# Patient Record
Sex: Male | Born: 1960 | Race: Black or African American | Hispanic: No | Marital: Single | Smoking: Heavy tobacco smoker
Health system: Southern US, Community
[De-identification: ages and names within clinical notes are randomized; demographics above are authoritative.]

## PROBLEM LIST (undated history)

## (undated) DIAGNOSIS — I10 Essential (primary) hypertension: Secondary | ICD-10-CM

## (undated) HISTORY — PX: CARDIAC SURGERY: SHX584

## (undated) HISTORY — PX: ANKLE SURGERY: SHX546

## (undated) HISTORY — PX: ABDOMINAL SURGERY: SHX537

## (undated) HISTORY — PX: RECONSTRUCTION, ANKLE: SHX5009

## (undated) HISTORY — DX: Essential (primary) hypertension: I10

## (undated) HISTORY — PX: OTHER SURGICAL HISTORY: SHX169

---

## 2011-01-08 ENCOUNTER — Observation Stay
Admission: EM | Admit: 2011-01-08 | Discharge: 2011-01-08 | Disposition: A | Discharge status: Home / Self Care | Payer: Self-pay | Source: Emergency Department | Attending: Surgery | Admitting: Surgery

## 2011-01-08 LAB — CBC AND DIFFERENTIAL
Basophils Absolute Automated: 0.02 10*3/uL (ref 0.00–0.20)
Basophils Automated: 0 % (ref 0–2)
Eosinophils Absolute Automated: 0.01 10*3/uL (ref 0.00–0.70)
Eosinophils Automated: 0 % (ref 0–5)
Hematocrit: 45.1 % (ref 42.0–52.0)
Hgb: 15.9 g/dL (ref 13.0–17.0)
Immature Granulocytes Absolute: 0.01 10*3/uL
Immature Granulocytes: 0 % (ref 0–1)
Lymphocytes Absolute Automated: 4.11 10*3/uL (ref 0.50–4.40)
Lymphocytes Automated: 49 % — ABNORMAL HIGH (ref 15–41)
MCH: 30.8 pg (ref 28.0–32.0)
MCHC: 35.3 g/dL (ref 32.0–36.0)
MCV: 87.4 fL (ref 80.0–100.0)
MPV: 11.6 fL (ref 9.4–12.3)
Monocytes Absolute Automated: 0.4 10*3/uL (ref 0.00–1.20)
Monocytes: 5 % (ref 0–11)
Neutrophils Absolute: 3.83 10*3/uL (ref 1.80–8.10)
Neutrophils: 46 % — ABNORMAL LOW (ref 52–75)
Nucleated RBC: 0 /100 WBC
Platelets: 200 10*3/uL (ref 140–400)
RBC: 5.16 10*6/uL (ref 4.70–6.00)
RDW: 15 % (ref 12–15)
WBC: 8.38 10*3/uL (ref 3.50–10.80)

## 2011-01-08 LAB — BASIC METABOLIC PANEL
BUN: 7 mg/dL — ABNORMAL LOW (ref 8–20)
CO2: 22 mEq/L (ref 21–30)
Calcium: 9.4 mg/dL (ref 8.6–10.2)
Chloride: 109 mEq/L — ABNORMAL HIGH (ref 98–107)
Creatinine: 0.9 mg/dL (ref 0.6–1.5)
Glucose: 113 mg/dL — ABNORMAL HIGH (ref 70–100)
Potassium: 4.5 mEq/L (ref 3.6–5.0)
Sodium: 147 mEq/L — ABNORMAL HIGH (ref 136–146)

## 2011-01-08 LAB — RAPID DRUG SCREEN, URINE
Barbiturate Screen, UR: NOT DETECTED
Benzodiazepine Screen, UR: NOT DETECTED
Cannabinoid Screen, UR: NOT DETECTED
Cocaine, UR: DETECTED — AB
Opiate Screen, UR: NOT DETECTED
PCP Screen, UR: NOT DETECTED
Urine Amphetamine Screen: NOT DETECTED

## 2011-01-08 LAB — PT AND APTT F/C
PT INR: 1 (ref 0.9–1.1)
PT: 13 s (ref 12.6–15.0)
PTT: 30 s (ref 23–37)

## 2011-01-08 LAB — GFR: EGFR: >60

## 2011-01-08 LAB — ETHANOL: Alcohol: 300 mg/dL — ABNORMAL HIGH

## 2011-03-25 LAB — ECG 12-LEAD
Atrial Rate: 85 {beats}/min
P Axis: 59 degrees
P-R Interval: 158 ms
Q-T Interval: 370 ms
QRS Duration: 86 ms
QTC Calculation (Bezet): 440 ms
R Axis: 76 degrees
T Axis: 31 degrees
Ventricular Rate: 85 {beats}/min
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
## 2014-03-16 ENCOUNTER — Emergency Department: Payer: Charity

## 2014-03-16 ENCOUNTER — Inpatient Hospital Stay: Payer: Charity | Admitting: Surgery

## 2014-03-16 ENCOUNTER — Encounter
Admission: EM | Disposition: A | Discharge status: Home / Self Care | Payer: Self-pay | Source: Home / Self Care | Attending: Surgery

## 2014-03-16 ENCOUNTER — Encounter: Payer: Self-pay | Admitting: Anesthesiology

## 2014-03-16 ENCOUNTER — Other Ambulatory Visit: Payer: Self-pay

## 2014-03-16 ENCOUNTER — Inpatient Hospital Stay
Admission: EM | Admit: 2014-03-16 | Discharge: 2014-03-19 | DRG: 331 | Disposition: A | Discharge status: Home / Self Care | Payer: Charity | Attending: Surgery | Admitting: Surgery

## 2014-03-16 DIAGNOSIS — S31109A Unspecified open wound of abdominal wall, unspecified quadrant without penetration into peritoneal cavity, initial encounter: Secondary | ICD-10-CM | POA: Diagnosis present

## 2014-03-16 DIAGNOSIS — S36509A Unspecified injury of unspecified part of colon, initial encounter: Secondary | ICD-10-CM | POA: Diagnosis present

## 2014-03-16 DIAGNOSIS — S36501A Unspecified injury of transverse colon, initial encounter: Principal | ICD-10-CM | POA: Diagnosis present

## 2014-03-16 DIAGNOSIS — S31119A Laceration without foreign body of abdominal wall, unspecified quadrant without penetration into peritoneal cavity, initial encounter: Secondary | ICD-10-CM

## 2014-03-16 DIAGNOSIS — F1010 Alcohol abuse, uncomplicated: Secondary | ICD-10-CM | POA: Diagnosis present

## 2014-03-16 DIAGNOSIS — G8911 Acute pain due to trauma: Secondary | ICD-10-CM | POA: Diagnosis present

## 2014-03-16 DIAGNOSIS — Z5331 Laparoscopic surgical procedure converted to open procedure: Secondary | ICD-10-CM

## 2014-03-16 DIAGNOSIS — F17200 Nicotine dependence, unspecified, uncomplicated: Secondary | ICD-10-CM | POA: Diagnosis present

## 2014-03-16 DIAGNOSIS — I10 Essential (primary) hypertension: Secondary | ICD-10-CM | POA: Diagnosis present

## 2014-03-16 HISTORY — DX: Essential (primary) hypertension: I10

## 2014-03-16 HISTORY — PX: LAPAROSCOPY, DIAGNOSTIC: SHX4584

## 2014-03-16 LAB — BASIC METABOLIC PANEL
BUN: 11 mg/dL (ref 9.0–28.0)
CO2: 19 mEq/L — ABNORMAL LOW (ref 22–29)
Calcium: 8.9 mg/dL (ref 8.5–10.5)
Chloride: 102 mEq/L (ref 100–111)
Creatinine: 1 mg/dL (ref 0.7–1.3)
Glucose: 175 mg/dL — ABNORMAL HIGH (ref 70–100)
Potassium: 4.1 mEq/L (ref 3.5–5.1)
Sodium: 135 mEq/L — ABNORMAL LOW (ref 136–145)

## 2014-03-16 LAB — CBC
Hematocrit: 42.5 % (ref 42.0–52.0)
Hgb: 14.5 g/dL (ref 13.0–17.0)
MCH: 30.9 pg (ref 28.0–32.0)
MCHC: 34.1 g/dL (ref 32.0–36.0)
MCV: 90.6 fL (ref 80.0–100.0)
MPV: 11.6 fL (ref 9.4–12.3)
Nucleated RBC: 0 /100 WBC (ref 0–1)
Platelets: 153 10*3/uL (ref 140–400)
RBC: 4.69 10*6/uL — ABNORMAL LOW (ref 4.70–6.00)
RDW: 15 % (ref 12–15)
WBC: 8.14 10*3/uL (ref 3.50–10.80)

## 2014-03-16 LAB — ETHANOL: Alcohol: 193 mg/dL — ABNORMAL HIGH

## 2014-03-16 LAB — GFR: EGFR: >60

## 2014-03-16 SURGERY — LAPAROSCOPY, DIAGNOSTIC
Anesthesia: Anesthesia General | Site: Abdomen | Wound class: Clean Contaminated

## 2014-03-16 MED ORDER — SODIUM CHLORIDE 0.9 % IV SOLN
INTRAVENOUS | Status: AC | PRN
Start: 2014-03-16 — End: 2014-03-16
  Administered 2014-03-16: 1000 mL via INTRAVENOUS

## 2014-03-16 MED ORDER — LIDOCAINE HCL 1 % IJ SOLN
INTRAMUSCULAR | Status: AC
Start: 2014-03-16 — End: ?
  Filled 2014-03-16: qty 20

## 2014-03-16 MED ORDER — LIDOCAINE HCL (PF) 2 % IJ SOLN
INTRAMUSCULAR | Status: AC
Start: 2014-03-16 — End: ?
  Filled 2014-03-16: qty 5

## 2014-03-16 MED ORDER — PROPOFOL 10 MG/ML IV EMUL
INTRAVENOUS | Status: AC
Start: 2014-03-16 — End: ?
  Filled 2014-03-16: qty 40

## 2014-03-16 MED ORDER — FENTANYL CITRATE 0.05 MG/ML IJ SOLN
INTRAMUSCULAR | Status: AC
Start: 2014-03-16 — End: ?
  Filled 2014-03-16: qty 2

## 2014-03-16 MED ORDER — PROPOFOL 10 MG/ML IV EMUL
INTRAVENOUS | Status: AC
Start: 2014-03-16 — End: ?
  Filled 2014-03-16: qty 20

## 2014-03-16 MED ORDER — ROCURONIUM BROMIDE 50 MG/5ML IV SOLN
INTRAVENOUS | Status: AC
Start: 2014-03-16 — End: ?
  Filled 2014-03-16: qty 5

## 2014-03-16 MED ORDER — EPHEDRINE SULFATE 50 MG/ML IJ SOLN
INTRAMUSCULAR | Status: AC
Start: 2014-03-16 — End: ?
  Filled 2014-03-16: qty 1

## 2014-03-16 MED ORDER — SUCCINYLCHOLINE CHLORIDE 20 MG/ML IJ SOLN
INTRAMUSCULAR | Status: AC
Start: 2014-03-16 — End: ?
  Filled 2014-03-16: qty 10

## 2014-03-16 MED ORDER — FAMOTIDINE 20 MG/2ML IV SOLN
INTRAVENOUS | Status: AC
Start: 2014-03-16 — End: ?
  Filled 2014-03-16: qty 2

## 2014-03-16 MED ORDER — BUPIVACAINE-EPINEPHRINE (PF) 0.5% -1:200000 IJ SOLN
INTRAMUSCULAR | Status: AC
Start: 2014-03-16 — End: ?
  Filled 2014-03-16: qty 20

## 2014-03-16 SURGICAL SUPPLY — 1 items: DRESSING FLEXZAN 4X4 (Dressing) ×20 IMPLANT

## 2014-03-16 NOTE — ED Notes (Signed)
ID band applied to LEFT wrist.

## 2014-03-16 NOTE — Anesthesia Preprocedure Evaluation (Addendum)
Anesthesia Evaluation    AIRWAY    Mallampati: II    TM distance: >3 FB  Neck ROM: full  Mouth Opening:full   CARDIOVASCULAR    normal       DENTAL         PULMONARY    pulmonary exam normal and clear to auscultation     OTHER FINDINGS    Extremely poor dentition.  Fractured teeth                Anesthesia Plan    ASA 3 - emergent     general                     intravenous induction   Detailed anesthesia plan: general endotracheal        Post op pain management: per surgeon        Plan discussed with CRNA.
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
## 2014-03-17 ENCOUNTER — Emergency Department: Payer: Charity | Admitting: Anesthesiology

## 2014-03-17 DIAGNOSIS — G8911 Acute pain due to trauma: Secondary | ICD-10-CM

## 2014-03-17 DIAGNOSIS — S31119A Laceration without foreign body of abdominal wall, unspecified quadrant without penetration into peritoneal cavity, initial encounter: Secondary | ICD-10-CM | POA: Diagnosis present

## 2014-03-17 DIAGNOSIS — S36509A Unspecified injury of unspecified part of colon, initial encounter: Secondary | ICD-10-CM

## 2014-03-17 DIAGNOSIS — S36501A Unspecified injury of transverse colon, initial encounter: Secondary | ICD-10-CM

## 2014-03-17 LAB — CBC AND DIFFERENTIAL
Basophils Absolute Automated: 0.01 10*3/uL (ref 0.00–0.20)
Basophils Automated: 0 %
Eosinophils Absolute Automated: 0 10*3/uL (ref 0.00–0.70)
Eosinophils Automated: 0 %
Hematocrit: 28 % — ABNORMAL LOW (ref 42.0–52.0)
Hgb: 9.5 g/dL — ABNORMAL LOW (ref 13.0–17.0)
Immature Granulocytes Absolute: 0.02 10*3/uL
Immature Granulocytes: 0 %
Lymphocytes Absolute Automated: 1.96 10*3/uL (ref 0.50–4.40)
Lymphocytes Automated: 20 %
MCH: 30.8 pg (ref 28.0–32.0)
MCHC: 33.9 g/dL (ref 32.0–36.0)
MCV: 90.9 fL (ref 80.0–100.0)
MPV: 11.6 fL (ref 9.4–12.3)
Monocytes Absolute Automated: 0.62 10*3/uL (ref 0.00–1.20)
Monocytes: 6 %
Neutrophils Absolute: 7.39 10*3/uL (ref 1.80–8.10)
Neutrophils: 74 %
Nucleated RBC: 0 /100 WBC (ref 0–1)
Platelets: 123 10*3/uL — ABNORMAL LOW (ref 140–400)
RBC: 3.08 10*6/uL — ABNORMAL LOW (ref 4.70–6.00)
RDW: 15 % (ref 12–15)
WBC: 10 10*3/uL (ref 3.50–10.80)

## 2014-03-17 LAB — TYPE AND SCREEN
AB Screen Gel: NEGATIVE
ABO Rh: O POS

## 2014-03-17 LAB — RED BLOOD CELLS OR HOLD

## 2014-03-17 LAB — CBC
Hematocrit: 31.1 % — ABNORMAL LOW (ref 42.0–52.0)
Hgb: 10.3 g/dL — ABNORMAL LOW (ref 13.0–17.0)
MCH: 30 pg (ref 28.0–32.0)
MCHC: 33.1 g/dL (ref 32.0–36.0)
MCV: 90.7 fL (ref 80.0–100.0)
MPV: 12 fL (ref 9.4–12.3)
Nucleated RBC: 0 /100 WBC (ref 0–1)
Platelets: 130 10*3/uL — ABNORMAL LOW (ref 140–400)
RBC: 3.43 10*6/uL — ABNORMAL LOW (ref 4.70–6.00)
RDW: 15 % (ref 12–15)
WBC: 11.39 10*3/uL — ABNORMAL HIGH (ref 3.50–10.80)

## 2014-03-17 LAB — PT AND APTT
PT INR: 1.1 (ref 0.9–1.1)
PT: 13.9 s (ref 12.6–15.0)
PTT: 27 s (ref 23–37)

## 2014-03-17 LAB — HEMOGLOBIN AND HEMATOCRIT, BLOOD
Hematocrit: 33.6 % — ABNORMAL LOW (ref 42.0–52.0)
Hgb: 11.6 g/dL — ABNORMAL LOW (ref 13.0–17.0)

## 2014-03-17 MED ORDER — CEFAZOLIN SODIUM 1 G IJ SOLR
INTRAMUSCULAR | Status: DC | PRN
Start: 2014-03-17 — End: 2014-03-17
  Administered 2014-03-17: 2 g via INTRAVENOUS

## 2014-03-17 MED ORDER — MEPERIDINE HCL 25 MG/ML IJ SOLN
25.0000 mg | INTRAMUSCULAR | Status: DC | PRN
Start: 2014-03-17 — End: 2014-03-17

## 2014-03-17 MED ORDER — LACTATED RINGERS IV SOLN
INTRAVENOUS | Status: DC
Start: 2014-03-17 — End: 2014-03-17

## 2014-03-17 MED ORDER — MICROFIBRILLAR COLL HEMOSTAT EX PADS
MEDICATED_PAD | CUTANEOUS | Status: DC | PRN
Start: 2014-03-17 — End: 2014-03-17
  Administered 2014-03-17: 1 via TOPICAL

## 2014-03-17 MED ORDER — NEOSTIGMINE METHYLSULFATE 1 MG/ML IJ SOLN
INTRAMUSCULAR | Status: DC | PRN
Start: 2014-03-17 — End: 2014-03-17
  Administered 2014-03-17: 9 mL via INTRAVENOUS

## 2014-03-17 MED ORDER — OXYCODONE HCL 5 MG PO TABS
10.0000 mg | ORAL_TABLET | ORAL | Status: DC | PRN
Start: 2014-03-17 — End: 2014-03-19
  Administered 2014-03-17 – 2014-03-19 (×6): 10 mg via ORAL
  Filled 2014-03-17 (×6): qty 2

## 2014-03-17 MED ORDER — OXYCODONE HCL 5 MG PO TABS
5.0000 mg | ORAL_TABLET | ORAL | Status: DC | PRN
Start: 2014-03-17 — End: 2014-03-19

## 2014-03-17 MED ORDER — FAMOTIDINE 10 MG/ML IV SOLN (WRAP)
INTRAVENOUS | Status: DC | PRN
Start: 2014-03-17 — End: 2014-03-17
  Administered 2014-03-17: 20 mg via INTRAVENOUS

## 2014-03-17 MED ORDER — FENTANYL CITRATE 0.05 MG/ML IJ SOLN
INTRAMUSCULAR | Status: AC
Start: 2014-03-17 — End: ?
  Filled 2014-03-17: qty 2

## 2014-03-17 MED ORDER — ENOXAPARIN SODIUM 30 MG/0.3ML SC SOLN
30.0000 mg | Freq: Two times a day (BID) | SUBCUTANEOUS | Status: DC
Start: 2014-03-17 — End: 2014-03-19
  Administered 2014-03-17 – 2014-03-19 (×4): 30 mg via SUBCUTANEOUS
  Filled 2014-03-17 (×5): qty 0.3

## 2014-03-17 MED ORDER — ONDANSETRON HCL 4 MG/2ML IJ SOLN
INTRAMUSCULAR | Status: DC | PRN
Start: 2014-03-17 — End: 2014-03-17
  Administered 2014-03-17: 4 mg via INTRAVENOUS

## 2014-03-17 MED ORDER — HYDROMORPHONE HCL PF 1 MG/ML IJ SOLN
0.5000 mg | INTRAMUSCULAR | Status: DC | PRN
Start: 2014-03-17 — End: 2014-03-19
  Administered 2014-03-17 – 2014-03-18 (×2): 0.5 mg via INTRAVENOUS
  Filled 2014-03-17 (×2): qty 1

## 2014-03-17 MED ORDER — ROCURONIUM BROMIDE 50 MG/5ML IV SOLN
INTRAVENOUS | Status: DC | PRN
Start: 2014-03-17 — End: 2014-03-17
  Administered 2014-03-17: 50 mg via INTRAVENOUS

## 2014-03-17 MED ORDER — LORAZEPAM 2 MG/ML IJ SOLN
2.0000 mg | INTRAMUSCULAR | Status: DC | PRN
Start: 2014-03-17 — End: 2014-03-17
  Administered 2014-03-17: 2 mg via INTRAVENOUS
  Filled 2014-03-17: qty 1

## 2014-03-17 MED ORDER — NALOXONE HCL 0.4 MG/ML IJ SOLN
0.2000 mg | INTRAMUSCULAR | Status: DC | PRN
Start: 2014-03-17 — End: 2014-03-19

## 2014-03-17 MED ORDER — HYDROMORPHONE HCL PF 1 MG/ML IJ SOLN
0.5000 mg | INTRAMUSCULAR | Status: DC | PRN
Start: 2014-03-17 — End: 2014-03-17
  Administered 2014-03-17 (×3): 0.5 mg via INTRAVENOUS

## 2014-03-17 MED ORDER — SENNOSIDES-DOCUSATE SODIUM 8.6-50 MG PO TABS
1.0000 | ORAL_TABLET | Freq: Two times a day (BID) | ORAL | Status: DC
Start: 2014-03-17 — End: 2014-03-19
  Administered 2014-03-17 – 2014-03-19 (×4): 1 via ORAL
  Filled 2014-03-17 (×4): qty 1

## 2014-03-17 MED ORDER — SODIUM CHLORIDE 0.9 % IV SOLN
INTRAVENOUS | Status: DC
Start: 2014-03-17 — End: 2014-03-19
  Administered 2014-03-17: 100 mL/h via INTRAVENOUS

## 2014-03-17 MED ORDER — LIDOCAINE HCL (PF) 2 % IJ SOLN
INTRAMUSCULAR | Status: AC
Start: 2014-03-17 — End: ?
  Filled 2014-03-17: qty 5

## 2014-03-17 MED ORDER — ONDANSETRON HCL 4 MG/2ML IJ SOLN
4.0000 mg | Freq: Once | INTRAMUSCULAR | Status: DC | PRN
Start: 2014-03-17 — End: 2014-03-17

## 2014-03-17 MED ORDER — ONDANSETRON 4 MG PO TBDP
4.0000 mg | ORAL_TABLET | Freq: Three times a day (TID) | ORAL | Status: DC | PRN
Start: 2014-03-17 — End: 2014-03-19
  Filled 2014-03-17 (×3): qty 1

## 2014-03-17 MED ORDER — SODIUM CHLORIDE 0.9 % IR SOLN
Status: DC | PRN
Start: 2014-03-17 — End: 2014-03-17
  Administered 2014-03-17: 1000 mL
  Administered 2014-03-17: 2000 mL

## 2014-03-17 MED ORDER — PROMETHAZINE HCL 25 MG/ML IJ SOLN
6.2500 mg | Freq: Once | INTRAMUSCULAR | Status: DC | PRN
Start: 2014-03-17 — End: 2014-03-17

## 2014-03-17 MED ORDER — FENTANYL CITRATE 0.05 MG/ML IJ SOLN
INTRAMUSCULAR | Status: DC | PRN
Start: 2014-03-17 — End: 2014-03-17
  Administered 2014-03-17: 25 ug via INTRAVENOUS
  Administered 2014-03-17 (×2): 50 ug via INTRAVENOUS
  Administered 2014-03-17: 100 ug via INTRAVENOUS
  Administered 2014-03-17 (×3): 25 ug via INTRAVENOUS

## 2014-03-17 MED ORDER — GLYCOPYRROLATE 0.2 MG/ML IJ SOLN
INTRAMUSCULAR | Status: AC
Start: 2014-03-17 — End: ?
  Filled 2014-03-17: qty 4

## 2014-03-17 MED ORDER — MIDAZOLAM HCL 2 MG/2ML IJ SOLN
INTRAMUSCULAR | Status: DC | PRN
Start: 2014-03-17 — End: 2014-03-17
  Administered 2014-03-17: 2 mg via INTRAVENOUS

## 2014-03-17 MED ORDER — ONDANSETRON HCL 4 MG/2ML IJ SOLN
INTRAMUSCULAR | Status: AC
Start: 2014-03-17 — End: ?
  Filled 2014-03-17: qty 2

## 2014-03-17 MED ORDER — ROCURONIUM BROMIDE 50 MG/5ML IV SOLN
INTRAVENOUS | Status: AC
Start: 2014-03-17 — End: ?
  Filled 2014-03-17: qty 5

## 2014-03-17 MED ORDER — HYDROMORPHONE HCL PF 1 MG/ML IJ SOLN
0.5000 mg | INTRAMUSCULAR | Status: DC | PRN
Start: 2014-03-17 — End: 2014-03-17
  Administered 2014-03-17: 0.5 mg via INTRAVENOUS
  Filled 2014-03-17: qty 1

## 2014-03-17 MED ORDER — ALBUMIN HUMAN 5 % IV SOLN
INTRAVENOUS | Status: DC | PRN
Start: 2014-03-17 — End: 2014-03-17

## 2014-03-17 MED ORDER — LACTATED RINGERS IV SOLN
INTRAVENOUS | Status: DC | PRN
Start: 2014-03-17 — End: 2014-03-17

## 2014-03-17 MED ORDER — LACTATED RINGERS IV BOLUS
1000.0000 mL | Freq: Once | INTRAVENOUS | Status: AC
Start: 2014-03-17 — End: 2014-03-17
  Administered 2014-03-17: 1000 mL via INTRAVENOUS

## 2014-03-17 MED ORDER — HYDROMORPHONE HCL PF 1 MG/ML IJ SOLN
INTRAMUSCULAR | Status: AC
Start: 2014-03-17 — End: 2014-03-17
  Administered 2014-03-17: 0.5 mg via INTRAVENOUS
  Filled 2014-03-17: qty 2

## 2014-03-17 MED ORDER — ACETAMINOPHEN 500 MG PO TABS
1000.0000 mg | ORAL_TABLET | Freq: Four times a day (QID) | ORAL | Status: DC
Start: 2014-03-17 — End: 2014-03-19
  Administered 2014-03-17 – 2014-03-19 (×7): 1000 mg via ORAL
  Filled 2014-03-17 (×7): qty 2

## 2014-03-17 MED ORDER — ACETAMINOPHEN 500 MG PO TABS
ORAL_TABLET | ORAL | Status: AC
Start: 2014-03-17 — End: 2014-03-17
  Administered 2014-03-17: 1000 mg via ORAL
  Filled 2014-03-17: qty 2

## 2014-03-17 MED ORDER — ONDANSETRON HCL 4 MG/2ML IJ SOLN
4.0000 mg | Freq: Three times a day (TID) | INTRAMUSCULAR | Status: DC | PRN
Start: 2014-03-17 — End: 2014-03-19

## 2014-03-17 MED ORDER — HYDROMORPHONE HCL PF 1 MG/ML IJ SOLN
INTRAMUSCULAR | Status: AC
Start: 2014-03-17 — End: 2014-03-17
  Administered 2014-03-17: 0.5 mg via INTRAVENOUS
  Filled 2014-03-17: qty 1
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
## 2014-03-17 NOTE — PACU (Signed)
I have called the patient's girlfriend, Paulene Floor at her home [phone]), per patient's request. I've updated her as to his pending admission and patient's location.

## 2014-03-17 NOTE — OR Nursing (Signed)
FOLEY DRAINING CLEAR YELLOW URINE AND SECURED TO THIGH. DRESSINGS DRY AND DRY.----BJWD
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
## 2014-03-17 NOTE — Progress Notes (Signed)
Pt Aox3 intact nuerovascular status, pain under control with pain med, tolerated clear liquids well and tolerted advanced diet to regular diet, shadowed drainage on dressing to abdomen, foley patent, family at bedside.

## 2014-03-17 NOTE — Plan of Care (Signed)
Problem: Pain  Goal: Patient's pain/discomfort is manageable  Outcome: Completed Date Met:  03/17/14

## 2014-03-17 NOTE — Anesthesia Postprocedure Evaluation (Signed)
The patient is awake or easily arousable.    The patient's respirations and cardiovascular status have been evaluated and are stable.    Post operative nausea, vomiting and pain have been treated as effectively as possible without compromising the patient's respiratory and cardiovascular status.    Refer to Post Op PACU documentation for confirmation of attainment of normothermia and adequate hydration.    There were no obvious anesthetic related complications.    The patient has recovered adequately to be transferred at this time.
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
## 2014-03-17 NOTE — Plan of Care (Signed)
Problem: Safety  Goal: Patient will be free from injury during hospitalization  Outcome: Completed Date Met:  03/17/14

## 2014-03-17 NOTE — Transfer of Care (Signed)
Anesthesia Transfer of Care Note    Patient: Corey Orozco    Procedures performed: Procedure(s):  Diagnostic Laparoscopy; Exploratory Laparotomy; Colon Repair     Anesthesia type: General ETT    Patient location:Phase I PACU    Last vitals:   Filed Vitals:    03/16/14 2332   BP: 148/101   Pulse:    Temp:    Resp:    SpO2:        Post pain: Patient not complaining of pain, continue current therapy      Mental Status:awake    Respiratory Function: tolerating face mask    Cardiovascular: stable    Nausea/Vomiting: patient not complaining of nausea or vomiting    Hydration Status: adequate    Post assessment: no apparent anesthetic complications, no reportable events and no evidence of recall will awake to voice, snoring at times when sleeping, comfortable, report to RN

## 2014-03-17 NOTE — PACU (Signed)
Abdominal distention and increased drainage through gauze. HR 110-115, pain well controlled, BP stable. Dr. Pearletha Alfred notified. Started 1L LR bolus.

## 2014-03-17 NOTE — Plan of Care (Signed)
Problem: Pain  Goal: Patient's pain/discomfort is manageable  Outcome: Progressing  Instruct patient to call for pain medication. Assess pain level with rounding.

## 2014-03-17 NOTE — PACU (Signed)
Dr Pearletha Alfred is rounding on the patient. Stat CBC was sent.

## 2014-03-17 NOTE — Progress Notes (Signed)
Called by nursing to see patient re: abdomen & dressing. Patient tachycardic to low 100s-110s, given 1L bolus w/ some improvement. UOP adequate. Patient states that pain is improved vs yesterday before surgery    BP 153/82 mmHg  Pulse 105  Temp(Src) 98.3 F (36.8 C) (Temporal Artery)  Resp 12  SpO2 100%    Gen: awake, NAD, AAO x3  Abd: distended, soft but somewhat firm, tympanic, ATTP; dressing w/ serosanguinous drainage but intact    Plan:  Will check STAT CBC now  No evidence of active bleeding  Pain control prn as that seems to improve tachycardia  Monitor UOP    D/w Dr. Karie Georges, MD  General Surgery Resident, PGY-4  Pager: [phone]

## 2014-03-18 ENCOUNTER — Encounter: Payer: Self-pay | Admitting: Surgery

## 2014-03-18 DIAGNOSIS — F1411 Cocaine abuse, in remission: Secondary | ICD-10-CM

## 2014-03-18 DIAGNOSIS — F1020 Alcohol dependence, uncomplicated: Secondary | ICD-10-CM
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
## 2014-03-18 NOTE — Plan of Care (Signed)
Problem: Pain  Goal: Patient's pain/discomfort is manageable  Outcome: Progressing  Pain rated 4/10, 10mg  oxy iR given. Will to cont to assess.

## 2014-03-19 DIAGNOSIS — K560 Paralytic ileus: Secondary | ICD-10-CM

## 2014-03-19 DIAGNOSIS — S31109A Unspecified open wound of abdominal wall, unspecified quadrant without penetration into peritoneal cavity, initial encounter: Secondary | ICD-10-CM

## 2014-03-19 DIAGNOSIS — S36509A Unspecified injury of unspecified part of colon, initial encounter: Secondary | ICD-10-CM | POA: Diagnosis present

## 2014-03-19 DIAGNOSIS — G8911 Acute pain due to trauma: Secondary | ICD-10-CM | POA: Diagnosis present

## 2014-03-19 MED ORDER — BISACODYL 10 MG RE SUPP
10.0000 mg | Freq: Once | RECTAL | Status: DC
Start: 2014-03-19 — End: 2014-03-19

## 2014-03-19 MED ORDER — FLEET ENEMA 7-19 GM/118ML RE ENEM
1.0000 | ENEMA | Freq: Once | RECTAL | Status: DC
Start: 2014-03-19 — End: 2014-03-19

## 2014-03-19 MED ORDER — SENNOSIDES-DOCUSATE SODIUM 8.6-50 MG PO TABS
1.0000 | ORAL_TABLET | Freq: Two times a day (BID) | ORAL | Status: AC
Start: 2014-03-19 — End: ?

## 2014-03-19 MED ORDER — FLEET ENEMA 7-19 GM/118ML RE ENEM
ENEMA | RECTAL | Status: AC
Start: 2014-03-19 — End: 2014-03-19
  Administered 2014-03-19: 1 via RECTAL
  Filled 2014-03-19: qty 1

## 2014-03-19 MED ORDER — OXYCODONE HCL 10 MG PO TABS
10.0000 mg | ORAL_TABLET | Freq: Four times a day (QID) | ORAL | Status: DC | PRN
Start: 2014-03-19 — End: 2014-04-01

## 2014-03-19 MED ORDER — HYDROCHLOROTHIAZIDE 25 MG PO TABS
12.5000 mg | ORAL_TABLET | Freq: Every day | ORAL | Status: DC
Start: 2014-03-19 — End: 2014-03-19
  Administered 2014-03-19: 12.5 mg via ORAL
  Filled 2014-03-19: qty 1

## 2014-03-19 MED ORDER — ACETAMINOPHEN 500 MG PO TABS
1000.0000 mg | ORAL_TABLET | Freq: Four times a day (QID) | ORAL | Status: AC
Start: 2014-03-19 — End: ?

## 2014-03-19 NOTE — Discharge Instructions (Signed)
Puncture Wound (General)  A puncture wound is a hole through the skin. Bacteria, dirt and debris can be drawn into this wound, increasing the risk of infection. However, antibiotics are usually not prescribed for this injury unless signs of infection are already present. Therefore, it is important to observe the wound closely for the signs of infection listed below.    Home Care:   If your wound is on an arm, hand, leg, or foot, keep that part raised during the first 48 hours to reduce swelling and pain.   Keep the wound clean and dry. If a bandage was applied and it becomes wet or dirty, replace it. Otherwise, leave it in place for the next 24 hours.   You may use acetaminophen (Tylenol) or ibuprofen (Motrin, Advil) to control pain, unless another medicine was prescribed. [NOTE: If you have chronic liver or kidney disease or ever had a stomach ulcer or GI bleeding, talk with your doctor before using these medicines.]   You may shower as usual. However, do not soak the area in water (no baths or swimming) during the first 48 hours.  Follow Up:  Most puncture wounds heal within 10 days. However, an infection may sometimes occur despite proper treatment. If small particles were drawn into the puncture wound (such as fragments of cloth, rubber, wood or dirt), an infection may occur. These fragments are very hard to find during the first exam since it is not possible to get a good look inside a puncture wound and they do not show on an X-ray. Antibiotics and a minor surgical procedure to find and remove the foreign object will be needed if this happens. Therefore, check the wound daily for the warning signs listed below.  Get Prompt Medical Attention  if any of the following occur:  SIGNS OF INFECTION:   Increasing pain in the wound   Redness, swelling, pus or red lines coming from the wound   Fever of 100.26F (38C) or higher, or as directed by your healthcare provider   2000-2014 [employer].  [address] [place], [place] [address]. All rights reserved. This information is not intended as a substitute for professional medical care. Always follow your healthcare professional's instructions.        Discharge Instructions: Caring for Your Abdominal Incision  You are going home with sutures (stitches) or surgical staples in place. Or you may have special strips of tape called Steri-Strips. One of these items was used to close your incision, help stop bleeding, and speed healing. Follow the tips on this sheet to help your incision heal.     Wash hands with soap and water.         Remove old dressing carefully.         Apply a new dressing as directed.      Home Care   Clean your work area:   Put pets in another room.   Use soap and water to clean the surface you'll be working on.   Spread a clean cloth or paper towel over the surface.   Move away from the clean surface if you need to cough or sneeze.   Gather your supplies:   Packaged dressing for your wound   Irrigation solutions (if using these)   Pair of scissors (cleaned with soap and water)   Medical tape   Disposable gloves (2 pairs)   Clean plastic trash bag (open it before you wash your hands)   Wash your  hands:   Use liquid soap.   Work up a good lather and scrub for 1 to 2 minutes.   Be sure to scrub between your fingers and under your nails.   Rinse with warm water, keeping your fingers pointed down.   Use a clean paper towel to dry your hands and turn off the faucet.   Prepare your dressing supplies:   Peel back the edges of the dressing packages. Pour any irrigation solutions into solution cups.   Cut each piece of tape4 inches longer than the dressing.   Remove the old dressing:   Put on disposable gloves.   Loosen the tape on the dressing by pulling gently toward the incision. Remove the dressing one layer at a time. Put it in the plastic bag immediately.   Remove your gloves and put them in the plastic bag. Wash  your hands.   Put on a new pair of gloves.   Clean and dress the incision:   Clean the incision and apply a new dressing as directed.   Put all used supplies in the plastic bag. Remove your gloves last and put them in the plastic bag. Seal the bag and put it in the trash.   Be sure to wash your hands again.  Follow-Up  Make a follow-up appointment as directed by our staff.    When to Call Your Doctor  Call your doctor right away if you have any of the following:   Increased pain, bleeding, redness, swelling, or foul-smelling discharge around the incision area   Feverof 101.55F(38.5C) or higher   Shaking chills   Vomiting or nausea that doesn't go away   Numbness, coldness, or tingling around the incision area   Opening of sutures or wound    2000-2014 [employer] [address] [place], [place] [address]. All rights reserved. This information is not intended as a substitute for professional medical care. Always follow your healthcare professional's instructions.          TRAUMA SERVICE DISCHARGE INSTRUCTIONS    Acute Care Surgery  [address]  [place], [place]  [address]  Phone [phone], [place] [phone]      Discharge Instructions and Follow Up Information:  -Your primary physician team during your stay was the Acute Care Surgery service (ACS) (also known as Trauma), whose surgeons specialize in General Surgery, Trauma Surgery, and Critical Care. Our surgeons are Board Certified in both General Surgery and Surgical Critical Care.    You have been provided with a list of doctors (see above) who treated you during your hospitalization.  Not all patients need to follow up with the surgeon.  If instructed to do so, please call [phone] within 48 hours of your discharge To make a Clinic appointment for the following date:         The address is [address] [place] [address]. [address].     If you have forms that need a doctor's signature:  - If you have insurance forms, FMLA  forms, or worker's compensation forms that need to be filled out by Designer, industrial/product, please mail or fax the forms to our office at the following address:  Trauma Services  [address]  [place], [place] [address]  Fax: [phone]    Pain Medication  The ACS service can manage your general postoperative or post-trauma pain. If your pain is from injuries or surgery that was handled by another surgery team or consultant,  such as Orthopedics, Neurosurgery, or Spine surgery, we kindly request that you call that surgeon's office to address your pain. If you have follow-up with the Trauma Service, please call the Trauma Service Office for medication refills at [phone].   Please plan accordingly as refills can take up to 24-48 hours.    Taking Pain Medications  It is important to take your medications on time and never take more than the prescribed amount.  Use your pain medication only as directed. Take only the medication that your health care provider tells you to take.  Take your pain medications with some food to avoid an upset stomach.  Remember medications take time to work.  Most pain relievers taken by mouth take at least 20-30 minutes to take effect.  So take your medication at regular times as directed.  Don't wait until the pain gets bad to take it.  Try to time your medication so that you take it before the beginning of an activity, such as dressing or sitting at the table for dinner.  Taking your medication at night may help you get a good night's rest.  If the pain lessens, try taking your pain medication less often. Start by trying to eliminate a dose of pain medication at a time of day when you experience less pain.  If you are not able to completely eliminate the dose, try replacing the dose with a dose of acetaminophen (Tylenol), unless you have been directed not to use acetaminophen use by a physician. In this way, you can slowly wean your use of pain medication as your pain  decreases, continuing to eliminate or replace other doses as described above, until you no longer need to take any medicine for pain.   Constipation is a common side effect of pain medications.  Eating lots of fruits and vegetables and drinking lots of water helps relieve constipation.  You should be taking a stool softener (ex. Colace) as long as you are on any narcotic pain medication.  You should be having a bowel movement at least every 2-3 days.  If not, then proceed with over the counter laxatives (Senokot -S/Miralax), enemas, or suppositories to fix the constipation. You can obtain these at your local pharmacy if you develop constipation.     Avoid drinking alcohol while taking pain medicine.    Avoid driving or operating machinery while taking pain medication.   Please keep track of how many pills you have left so you will not completely run out before you need a refill.     Call your doctor if you notice any of these symptoms:    . Nausea, vomiting, diarrhea, lasting constipation, or stomach cramps  . Breathing problems or a fast heart rate  . Feeling very tired, sluggish, or dizzy  . Skin rash  . Pain that is not being treated by the prescribed amount of medication

## 2014-03-19 NOTE — Plan of Care (Signed)
Problem: Health Promotion  Goal: STG - Patient will ambulate  Outcome: Progressing  Patient voicing desire to ambulate and going to cafeteria independently, without SOB on exertion.

## 2014-03-19 NOTE — Progress Notes (Signed)
Patient had a bowel movement and + flatus after fleet enema. Patient received smoking secession counseling but reluctant to change habits. Patient told that smoking place him on higher risk for wound dehiscence and recommended to stop. Also told to avoid activities to increase abdominal pressure. Poor compliance with recommendations. Patient to use abdominal binder at all times and follow up in trauma clinic in 2 weeks.    Christeen Douglas NP SL# [phone]
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
## 2014-03-19 NOTE — Progress Notes (Signed)
Patient [place] home prescription for and all [place] information provided.Abdominal binder in place.
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
## 2014-03-19 NOTE — Plan of Care (Signed)
Problem: Safety  Goal: Patient will be free from injury during hospitalization  Outcome: Progressing  yes  Intervention: Assess patient's risk for falls and implement fall prevention plan of care per policy  Shift assesment  Intervention: Provide and maintain safe environment  [place]our[place] rounding  Intervention: Use appropriate transfer methods  PT&OT      Problem: Pain  Goal: Patient's pain/discomfort is manageable  Outcome: Progressing  Under control    Comments:   Patient Corey Orozco home today.Enema given per NP order with result.Patient abdomen soft but tender.All [place] instruction given.

## 2014-03-25 ENCOUNTER — Telehealth: Payer: Self-pay

## 2014-03-25 NOTE — Telephone Encounter (Signed)
Patient is in [facility] at [facility].  Betsy Coder, PA, would like a call back to discuss what is going on with patient.  Her direct number is [phone].Please call ASAP.  Zack Seal      Spoke to [place], [place], who will call directly.  Zack Seal
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
## 2014-04-01 ENCOUNTER — Ambulatory Visit (INDEPENDENT_AMBULATORY_CARE_PROVIDER_SITE_OTHER): Payer: Charity | Admitting: Trauma Surgery

## 2014-04-01 ENCOUNTER — Encounter (INDEPENDENT_AMBULATORY_CARE_PROVIDER_SITE_OTHER): Payer: Self-pay | Admitting: Trauma Surgery

## 2014-04-01 VITALS — BP 129/87 | HR 89 | Temp 97.7°F | Ht 70.0 in | Wt 220.0 lb

## 2014-04-01 DIAGNOSIS — S31119D Laceration without foreign body of abdominal wall, unspecified quadrant without penetration into peritoneal cavity, subsequent encounter: Secondary | ICD-10-CM

## 2014-04-01 DIAGNOSIS — Z5189 Encounter for other specified aftercare: Secondary | ICD-10-CM

## 2014-04-01 DIAGNOSIS — S31109A Unspecified open wound of abdominal wall, unspecified quadrant without penetration into peritoneal cavity, initial encounter: Secondary | ICD-10-CM

## 2014-04-01 MED ORDER — OXYCODONE HCL 10 MG PO TABS
10.0000 mg | ORAL_TABLET | Freq: Four times a day (QID) | ORAL | Status: DC | PRN
Start: 2014-04-01 — End: 2017-10-27
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
## 2014-04-01 NOTE — Patient Instructions (Addendum)
You were seen in the [facility] today by Dr. Hewitt Shorts.     Your wound is healing and is not infected. We decided to leave the lower staples in place.  Return in 1 week for a 2:15pm appointment so we can remove these.    For questions or problems call:  Trauma & Acute Care Surgery Office  [phone]  [address]  [place], [place] [address]    To make a clinic follow-up appointment call:  [phone]    For radiology test scheduling you may call either:  [facility] Radiology, [phone]  [facility], [phone]  _______________________________________________________________________

## 2014-04-08 ENCOUNTER — Ambulatory Visit (INDEPENDENT_AMBULATORY_CARE_PROVIDER_SITE_OTHER): Payer: Charity | Admitting: Surgery

## 2014-04-11 ENCOUNTER — Ambulatory Visit (INDEPENDENT_AMBULATORY_CARE_PROVIDER_SITE_OTHER): Payer: Charity | Admitting: Trauma Surgery

## 2014-04-11 ENCOUNTER — Encounter (INDEPENDENT_AMBULATORY_CARE_PROVIDER_SITE_OTHER): Payer: Self-pay | Admitting: Trauma Surgery

## 2014-04-11 VITALS — BP 120/80 | HR 86 | Temp 98.0°F | Ht 70.0 in | Wt 227.2 lb

## 2014-04-11 DIAGNOSIS — S31109D Unspecified open wound of abdominal wall, unspecified quadrant without penetration into peritoneal cavity, subsequent encounter: Secondary | ICD-10-CM

## 2014-04-11 DIAGNOSIS — S31119D Laceration without foreign body of abdominal wall, unspecified quadrant without penetration into peritoneal cavity, subsequent encounter: Secondary | ICD-10-CM

## 2014-04-11 NOTE — Patient Instructions (Signed)
You were seen in the [facility] today by Dr. Hewitt Shorts.     Your incision and abdomen are healing well.  Take tylenol and ibuprofen for pain. Your current pain is normal for the healing process.  You do not need another appointment in the [facility] Surgery office, but please feel free to call if you have any questions or problems.      For questions or problems call:  Trauma & Acute Care Surgery Office  [phone]  [address]  [place], [place] [address]    To make a clinic follow-up appointment call:  [phone]    For radiology test scheduling you may call either:  [facility] Radiology, [phone]  [facility], [phone]  _______________________________________________________________________

## 2014-04-11 NOTE — Progress Notes (Signed)
Trauma & Acute Care Surgery Attending      Pt returns for remaining staple removal  Midline incision looks good, better than last visit. Two remaining staples removed.  No infection or hernia.  Patient reports occasional sharp pain under the incision, lasts a minute or so. Eating well, no constipation/diarrhea.  Told him this is normal for the healing process. I do not suspect a hernia or infection or other wound problem.  He should take tylenol and/or ibuprofen for pain.    No f/u needed    C. Hewitt Shorts, MD, FACS

## 2016-04-20 ENCOUNTER — Emergency Department (HOSPITAL_COMMUNITY): Payer: Non-veteran care

## 2016-04-20 ENCOUNTER — Encounter (HOSPITAL_COMMUNITY): Payer: Self-pay | Admitting: Emergency Medicine

## 2016-04-20 ENCOUNTER — Emergency Department (HOSPITAL_COMMUNITY)
Admission: EM | Admit: 2016-04-20 | Discharge: 2016-04-20 | Disposition: A | Discharge status: Home / Self Care | Payer: Non-veteran care | Attending: Emergency Medicine | Admitting: Emergency Medicine

## 2016-04-20 DIAGNOSIS — F17200 Nicotine dependence, unspecified, uncomplicated: Secondary | ICD-10-CM | POA: Insufficient documentation

## 2016-04-20 DIAGNOSIS — M542 Cervicalgia: Secondary | ICD-10-CM

## 2016-04-20 DIAGNOSIS — Z79899 Other long term (current) drug therapy: Secondary | ICD-10-CM | POA: Insufficient documentation

## 2016-04-20 DIAGNOSIS — R079 Chest pain, unspecified: Secondary | ICD-10-CM | POA: Insufficient documentation

## 2016-04-20 MED ORDER — KETOROLAC TROMETHAMINE 30 MG/ML IJ SOLN
30.0000 mg | Freq: Once | INTRAMUSCULAR | Status: AC
  Administered 2016-04-20: 30 mg via INTRAMUSCULAR
  Filled 2016-04-20: qty 1

## 2016-04-20 MED ORDER — IBUPROFEN 200 MG PO TABS: 400.0000 mg | ORAL_TABLET | Freq: Three times a day (TID) | ORAL | 0 refills | Status: AC

## 2016-04-20 NOTE — ED Notes (Signed)
Patient transported to X-ray 
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
## 2016-04-20 NOTE — Discharge Instructions (Signed)
As discussed, your evaluation today has been largely reassuring.  But, it is important that you monitor your condition carefully, and do not hesitate to return to the ED if you develop new, or concerning changes in your condition. ? ?Otherwise, please follow-up with your physician for appropriate ongoing care. ? ?
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
## 2017-10-05 IMAGING — DX DG CERVICAL SPINE COMPLETE 4+V
6 series · 6 of 6 positions shown · non-contrast
Comparison: None.

CLINICAL DATA: Neck pain, shoulder pain

EXAM:
CERVICAL SPINE - COMPLETE 4+ VIEW

[c-spine lat]
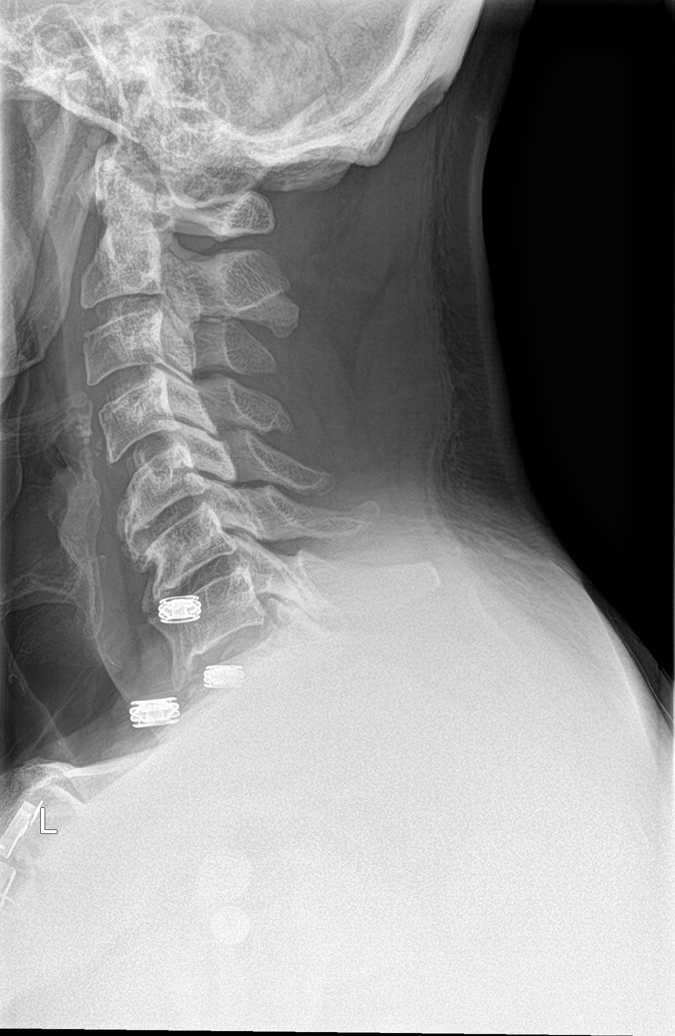

[c-spine obl (1 of 2)]
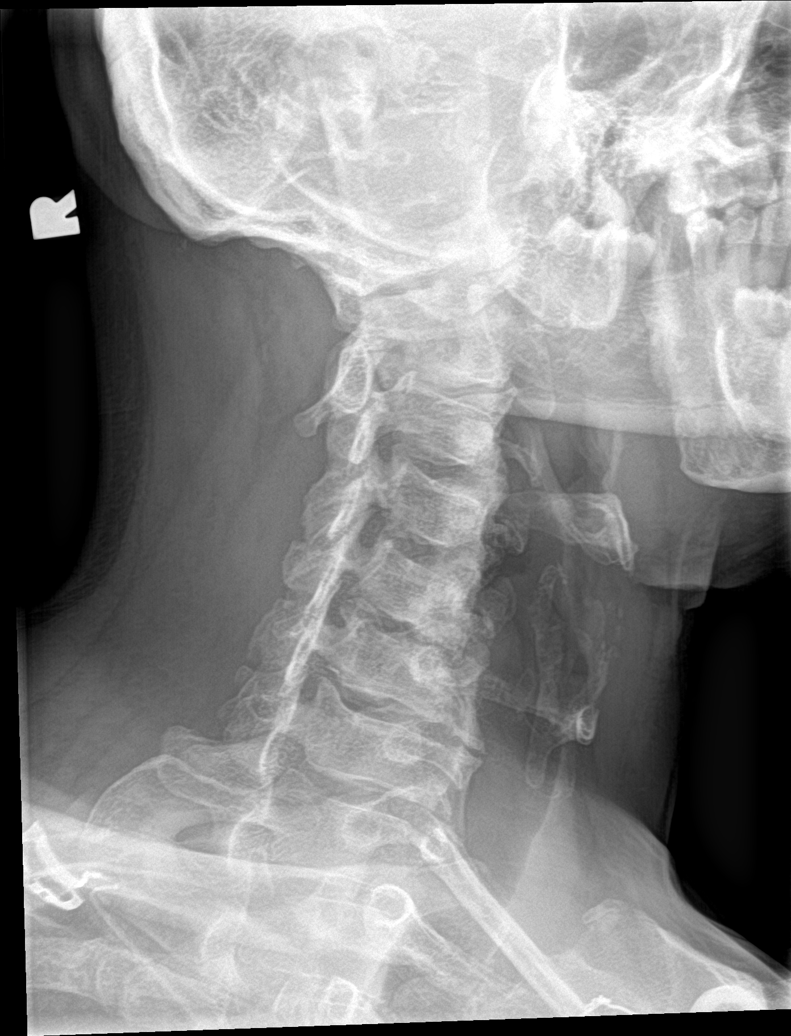

[c-spine obl (2 of 2)]
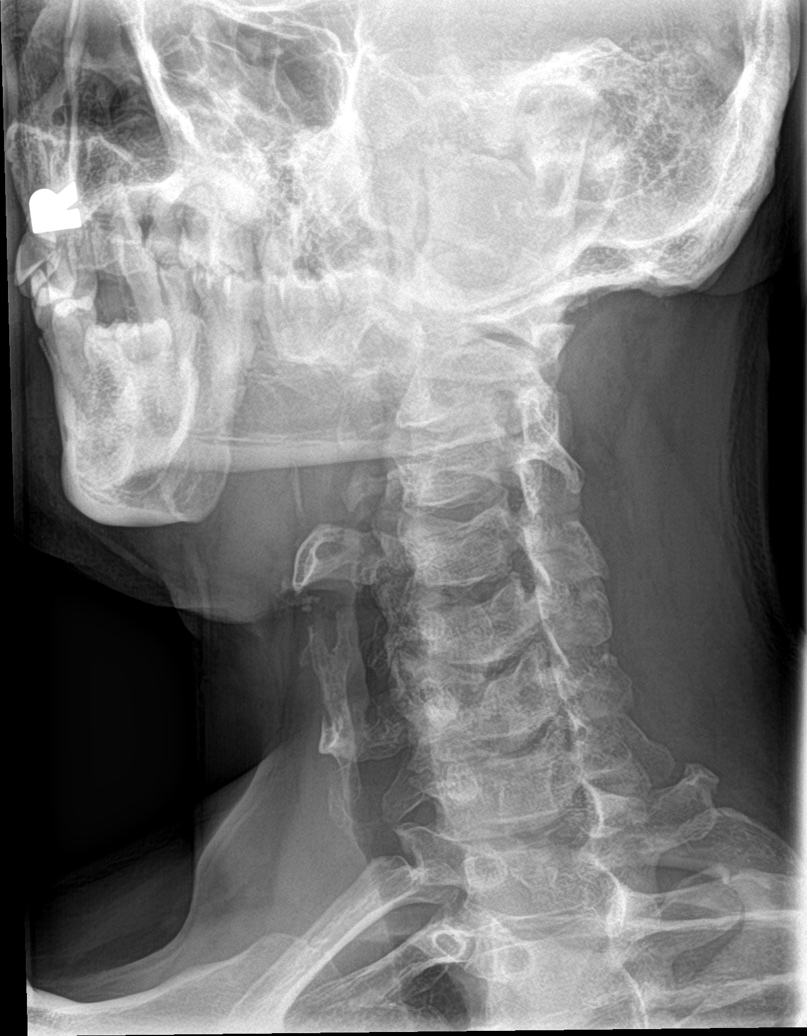

[c-spine ap]
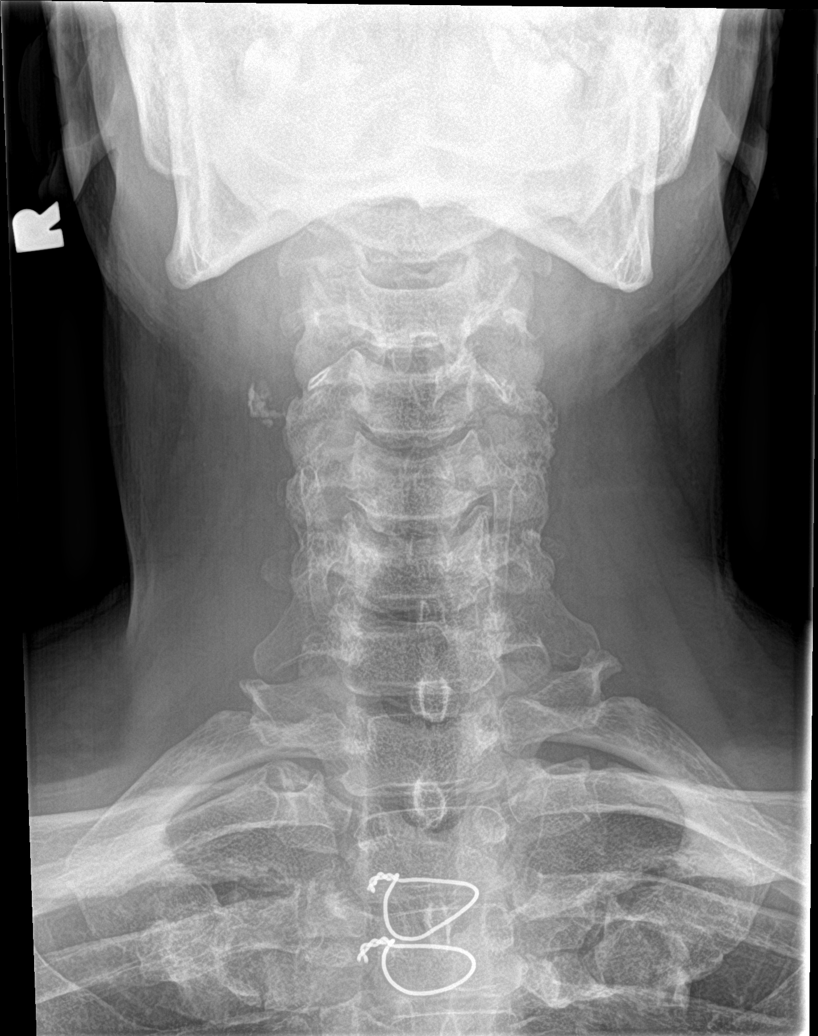

[c-spine open mouth]
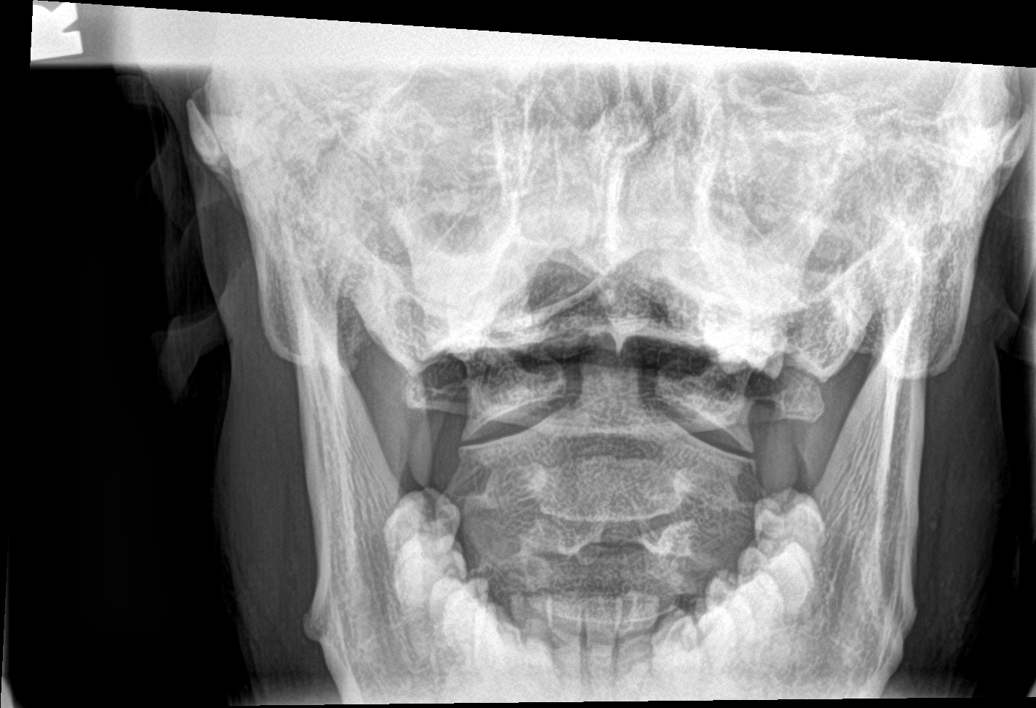

[c-spine swimmers trauma]
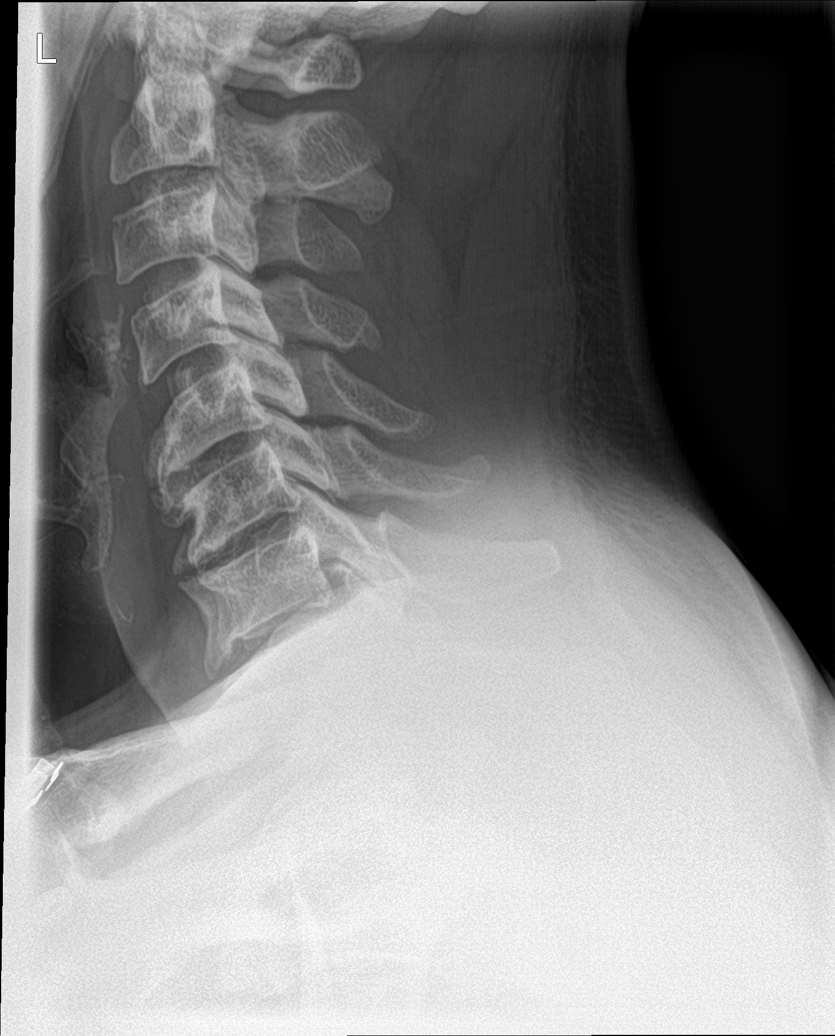

[6 of 6 positions shown; findings below may reference images not displayed]

FINDINGS: There is no evidence of cervical spine fracture or prevertebral soft
tissue swelling. Alignment is normal. No other significant bone
abnormalities are identified. Degenerative disc disease with mild
disc height loss at C5-6 and C6-7. Bilateral uncovertebral
degenerative changes at C5-6.
IMPRESSION: Mild cervical spine spondylosis.
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
## 2018-09-14 ENCOUNTER — Encounter (INDEPENDENT_AMBULATORY_CARE_PROVIDER_SITE_OTHER): Payer: Self-pay | Admitting: Internal Medicine

## 2018-09-14 ENCOUNTER — Ambulatory Visit (INDEPENDENT_AMBULATORY_CARE_PROVIDER_SITE_OTHER): Payer: Non-veteran care | Admitting: Internal Medicine

## 2020-03-12 DEATH — deceased
